# Patient Record
Sex: Female | Born: 1950 | Race: White | Hispanic: No | Marital: Married | Smoking: Never smoker
Health system: Southern US, Community
[De-identification: ages and names within clinical notes are randomized; demographics above are authoritative.]

## PROBLEM LIST (undated history)

## (undated) DIAGNOSIS — I10 Essential (primary) hypertension: Secondary | ICD-10-CM

## (undated) HISTORY — PX: INCONTINENCE SURGERY: SHX676

## (undated) HISTORY — DX: Essential (primary) hypertension: I10

## (undated) HISTORY — PX: ABDOMINAL HYSTERECTOMY: SHX81

---

## 2014-02-12 ENCOUNTER — Other Ambulatory Visit: Payer: Self-pay | Admitting: Internal Medicine

## 2014-02-12 DIAGNOSIS — Z1231 Encounter for screening mammogram for malignant neoplasm of breast: Secondary | ICD-10-CM

## 2014-03-04 ENCOUNTER — Ambulatory Visit: Payer: Self-pay

## 2014-05-27 ENCOUNTER — Ambulatory Visit: Payer: Self-pay

## 2014-05-28 ENCOUNTER — Ambulatory Visit
Admission: RE | Admit: 2014-05-28 | Discharge: 2014-05-28 | Disposition: A | Discharge status: Home / Self Care | Payer: 59 | Source: Ambulatory Visit | Attending: Internal Medicine | Admitting: Internal Medicine

## 2014-05-28 ENCOUNTER — Encounter (INDEPENDENT_AMBULATORY_CARE_PROVIDER_SITE_OTHER): Payer: Self-pay

## 2014-05-28 DIAGNOSIS — Z1231 Encounter for screening mammogram for malignant neoplasm of breast: Secondary | ICD-10-CM

## 2015-02-19 DIAGNOSIS — M25561 Pain in right knee: Secondary | ICD-10-CM | POA: Diagnosis not present

## 2015-02-19 DIAGNOSIS — M25562 Pain in left knee: Secondary | ICD-10-CM | POA: Diagnosis not present

## 2015-02-19 DIAGNOSIS — M1990 Unspecified osteoarthritis, unspecified site: Secondary | ICD-10-CM | POA: Diagnosis not present

## 2015-05-07 DIAGNOSIS — R197 Diarrhea, unspecified: Secondary | ICD-10-CM | POA: Diagnosis not present

## 2015-05-07 DIAGNOSIS — K219 Gastro-esophageal reflux disease without esophagitis: Secondary | ICD-10-CM | POA: Diagnosis not present

## 2015-05-07 DIAGNOSIS — R1310 Dysphagia, unspecified: Secondary | ICD-10-CM | POA: Diagnosis not present

## 2015-05-07 DIAGNOSIS — R1010 Upper abdominal pain, unspecified: Secondary | ICD-10-CM | POA: Diagnosis not present

## 2015-05-07 DIAGNOSIS — K580 Irritable bowel syndrome with diarrhea: Secondary | ICD-10-CM | POA: Diagnosis not present

## 2015-05-07 DIAGNOSIS — R140 Abdominal distension (gaseous): Secondary | ICD-10-CM | POA: Diagnosis not present

## 2015-05-14 DIAGNOSIS — Z0000 Encounter for general adult medical examination without abnormal findings: Secondary | ICD-10-CM | POA: Diagnosis not present

## 2015-05-14 DIAGNOSIS — K589 Irritable bowel syndrome without diarrhea: Secondary | ICD-10-CM | POA: Diagnosis not present

## 2015-05-14 DIAGNOSIS — Z1389 Encounter for screening for other disorder: Secondary | ICD-10-CM | POA: Diagnosis not present

## 2015-05-14 DIAGNOSIS — K219 Gastro-esophageal reflux disease without esophagitis: Secondary | ICD-10-CM | POA: Diagnosis not present

## 2015-05-14 DIAGNOSIS — I10 Essential (primary) hypertension: Secondary | ICD-10-CM | POA: Diagnosis not present

## 2015-05-14 DIAGNOSIS — M8588 Other specified disorders of bone density and structure, other site: Secondary | ICD-10-CM | POA: Diagnosis not present

## 2015-05-14 DIAGNOSIS — E782 Mixed hyperlipidemia: Secondary | ICD-10-CM | POA: Diagnosis not present

## 2015-11-10 DIAGNOSIS — E782 Mixed hyperlipidemia: Secondary | ICD-10-CM | POA: Diagnosis not present

## 2015-11-10 DIAGNOSIS — K219 Gastro-esophageal reflux disease without esophagitis: Secondary | ICD-10-CM | POA: Diagnosis not present

## 2015-11-10 DIAGNOSIS — I10 Essential (primary) hypertension: Secondary | ICD-10-CM | POA: Diagnosis not present

## 2016-03-17 DIAGNOSIS — R140 Abdominal distension (gaseous): Secondary | ICD-10-CM | POA: Diagnosis not present

## 2016-03-17 DIAGNOSIS — K589 Irritable bowel syndrome without diarrhea: Secondary | ICD-10-CM | POA: Diagnosis not present

## 2016-03-17 DIAGNOSIS — Z79899 Other long term (current) drug therapy: Secondary | ICD-10-CM | POA: Diagnosis not present

## 2016-05-29 ENCOUNTER — Other Ambulatory Visit: Payer: Self-pay | Admitting: Internal Medicine

## 2016-05-29 DIAGNOSIS — Z0000 Encounter for general adult medical examination without abnormal findings: Secondary | ICD-10-CM | POA: Diagnosis not present

## 2016-05-29 DIAGNOSIS — Z1159 Encounter for screening for other viral diseases: Secondary | ICD-10-CM | POA: Diagnosis not present

## 2016-05-29 DIAGNOSIS — K219 Gastro-esophageal reflux disease without esophagitis: Secondary | ICD-10-CM | POA: Diagnosis not present

## 2016-05-29 DIAGNOSIS — M1990 Unspecified osteoarthritis, unspecified site: Secondary | ICD-10-CM | POA: Diagnosis not present

## 2016-05-29 DIAGNOSIS — Z1231 Encounter for screening mammogram for malignant neoplasm of breast: Secondary | ICD-10-CM

## 2016-05-29 DIAGNOSIS — I10 Essential (primary) hypertension: Secondary | ICD-10-CM | POA: Diagnosis not present

## 2016-05-29 DIAGNOSIS — M8588 Other specified disorders of bone density and structure, other site: Secondary | ICD-10-CM | POA: Diagnosis not present

## 2016-05-29 DIAGNOSIS — K589 Irritable bowel syndrome without diarrhea: Secondary | ICD-10-CM | POA: Diagnosis not present

## 2016-05-29 DIAGNOSIS — Z1389 Encounter for screening for other disorder: Secondary | ICD-10-CM | POA: Diagnosis not present

## 2016-05-29 DIAGNOSIS — E782 Mixed hyperlipidemia: Secondary | ICD-10-CM | POA: Diagnosis not present

## 2016-07-07 ENCOUNTER — Ambulatory Visit: Payer: Self-pay

## 2016-07-11 DIAGNOSIS — R7301 Impaired fasting glucose: Secondary | ICD-10-CM | POA: Diagnosis not present

## 2016-07-17 ENCOUNTER — Ambulatory Visit
Admission: RE | Admit: 2016-07-17 | Discharge: 2016-07-17 | Disposition: A | Discharge status: Home / Self Care | Payer: Medicare Other | Source: Ambulatory Visit | Attending: Internal Medicine | Admitting: Internal Medicine

## 2016-07-17 DIAGNOSIS — Z1231 Encounter for screening mammogram for malignant neoplasm of breast: Secondary | ICD-10-CM | POA: Diagnosis not present

## 2016-11-29 DIAGNOSIS — K219 Gastro-esophageal reflux disease without esophagitis: Secondary | ICD-10-CM | POA: Diagnosis not present

## 2016-11-29 DIAGNOSIS — I10 Essential (primary) hypertension: Secondary | ICD-10-CM | POA: Diagnosis not present

## 2016-11-29 DIAGNOSIS — E782 Mixed hyperlipidemia: Secondary | ICD-10-CM | POA: Diagnosis not present

## 2016-11-29 DIAGNOSIS — M1990 Unspecified osteoarthritis, unspecified site: Secondary | ICD-10-CM | POA: Diagnosis not present

## 2017-05-08 DIAGNOSIS — M25562 Pain in left knee: Secondary | ICD-10-CM | POA: Diagnosis not present

## 2017-05-08 DIAGNOSIS — M25561 Pain in right knee: Secondary | ICD-10-CM | POA: Diagnosis not present

## 2017-05-11 DIAGNOSIS — M25461 Effusion, right knee: Secondary | ICD-10-CM | POA: Diagnosis not present

## 2017-05-11 DIAGNOSIS — M23321 Other meniscus derangements, posterior horn of medial meniscus, right knee: Secondary | ICD-10-CM | POA: Diagnosis not present

## 2017-05-11 DIAGNOSIS — M1710 Unilateral primary osteoarthritis, unspecified knee: Secondary | ICD-10-CM | POA: Diagnosis not present

## 2017-05-11 DIAGNOSIS — M25561 Pain in right knee: Secondary | ICD-10-CM | POA: Diagnosis not present

## 2017-06-05 DIAGNOSIS — M25561 Pain in right knee: Secondary | ICD-10-CM | POA: Diagnosis not present

## 2017-06-06 DIAGNOSIS — Z1239 Encounter for other screening for malignant neoplasm of breast: Secondary | ICD-10-CM | POA: Diagnosis not present

## 2017-06-06 DIAGNOSIS — Z0000 Encounter for general adult medical examination without abnormal findings: Secondary | ICD-10-CM | POA: Diagnosis not present

## 2017-06-06 DIAGNOSIS — F341 Dysthymic disorder: Secondary | ICD-10-CM | POA: Diagnosis not present

## 2017-06-06 DIAGNOSIS — E782 Mixed hyperlipidemia: Secondary | ICD-10-CM | POA: Diagnosis not present

## 2017-06-06 DIAGNOSIS — I10 Essential (primary) hypertension: Secondary | ICD-10-CM | POA: Diagnosis not present

## 2017-06-06 DIAGNOSIS — R7303 Prediabetes: Secondary | ICD-10-CM | POA: Diagnosis not present

## 2017-06-06 DIAGNOSIS — M8588 Other specified disorders of bone density and structure, other site: Secondary | ICD-10-CM | POA: Diagnosis not present

## 2017-06-06 DIAGNOSIS — M1990 Unspecified osteoarthritis, unspecified site: Secondary | ICD-10-CM | POA: Diagnosis not present

## 2017-06-06 DIAGNOSIS — K219 Gastro-esophageal reflux disease without esophagitis: Secondary | ICD-10-CM | POA: Diagnosis not present

## 2017-06-11 IMAGING — MG 2D DIGITAL SCREENING BILATERAL MAMMOGRAM WITH CAD AND ADJUNCT TO
8 of 13 series · 8 of 29 positions shown · non-contrast
Comparison: Previous exam(s).

CLINICAL DATA: Screening.

EXAM:
2D DIGITAL SCREENING BILATERAL MAMMOGRAM WITH CAD AND ADJUNCT TOMO

[R CC]
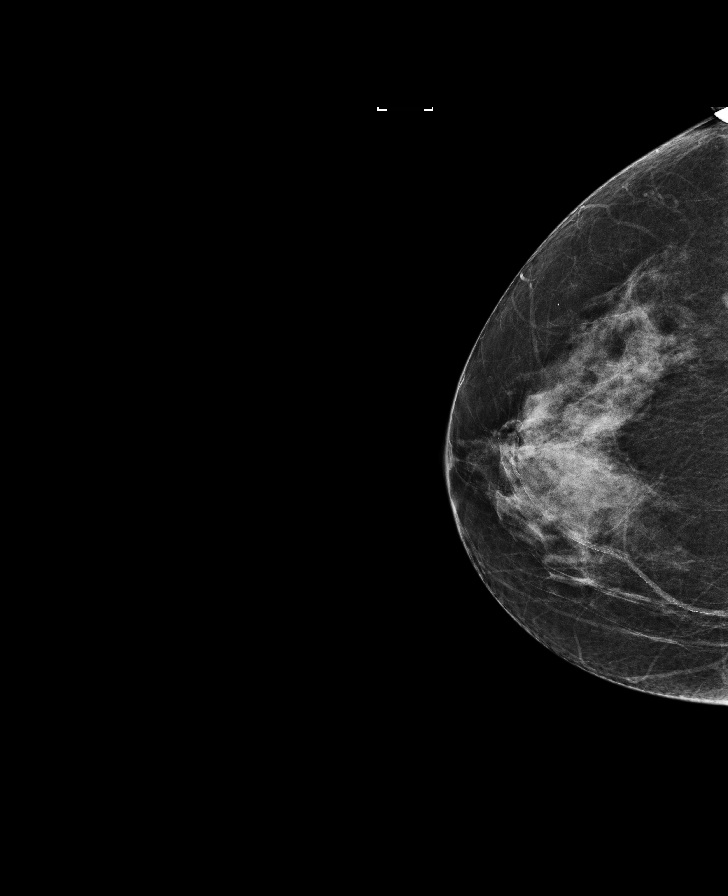

[R MLO synth-2D]
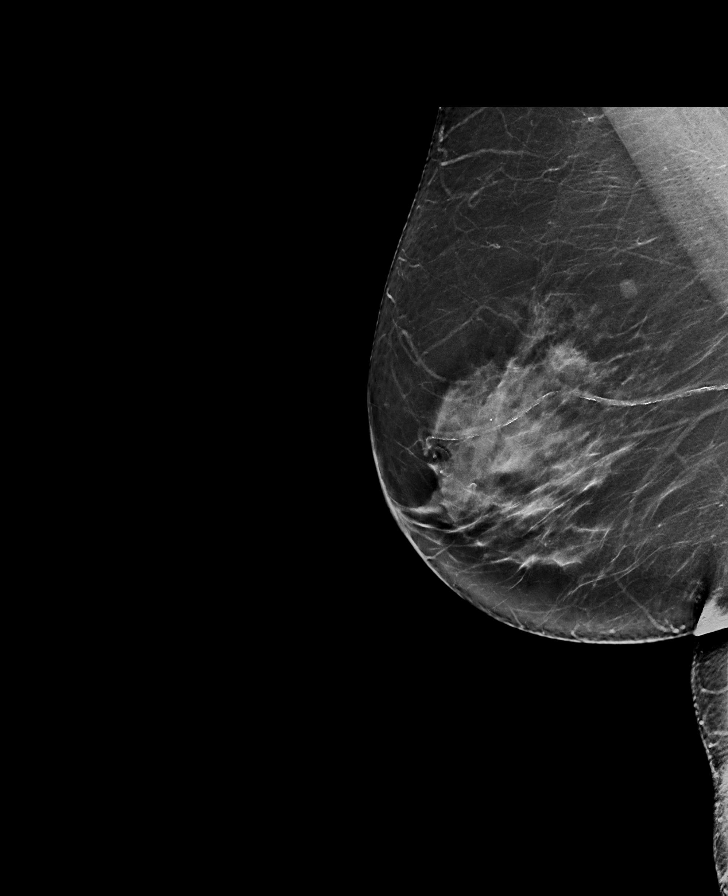

[L MLO synth-2D]
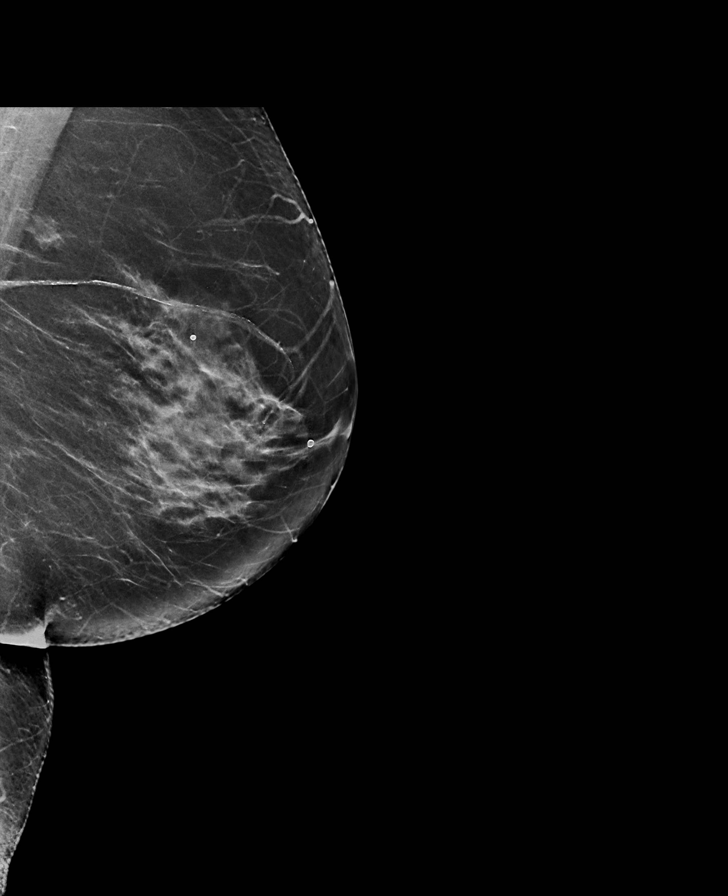

[R MLO]
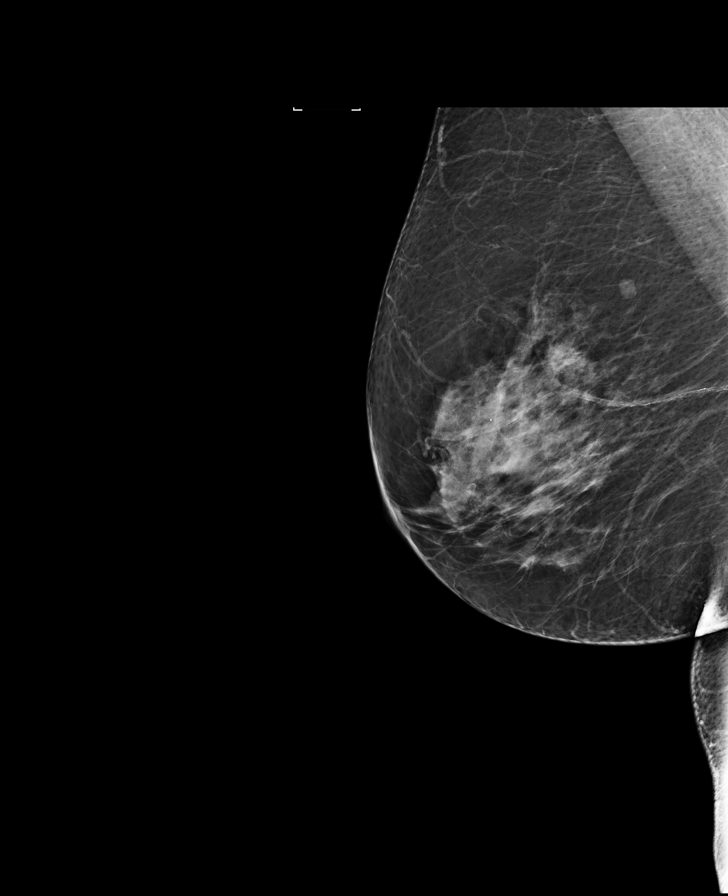

[L CC synth-2D]
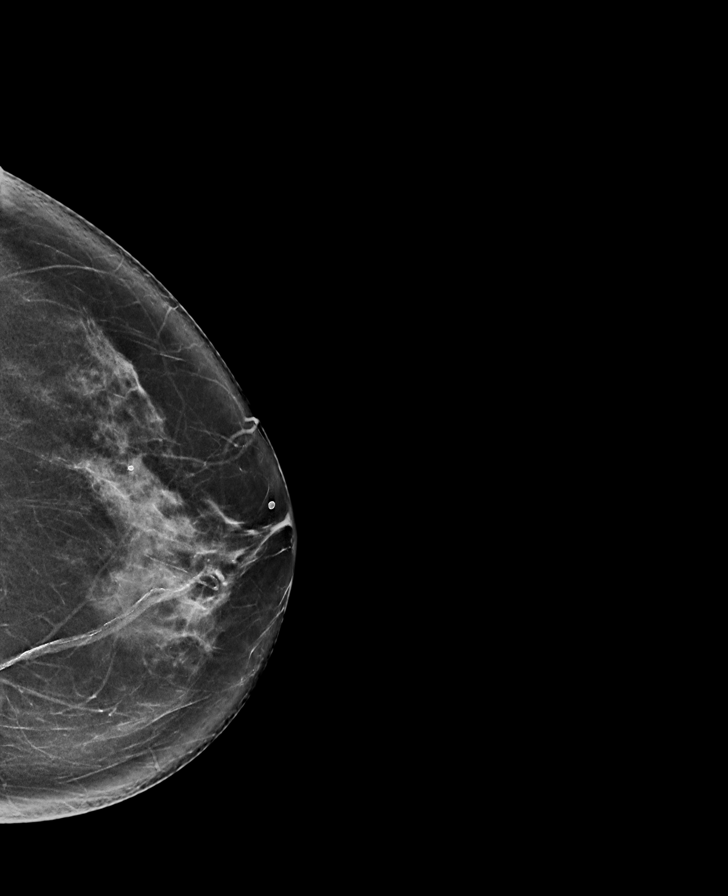

[L CC]
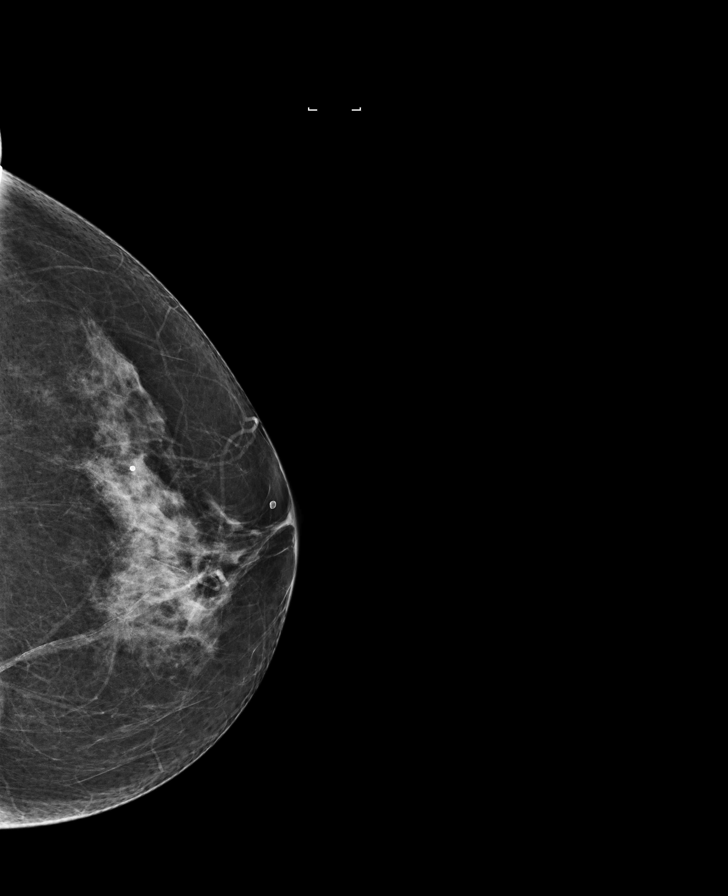

[R CC synth-2D]
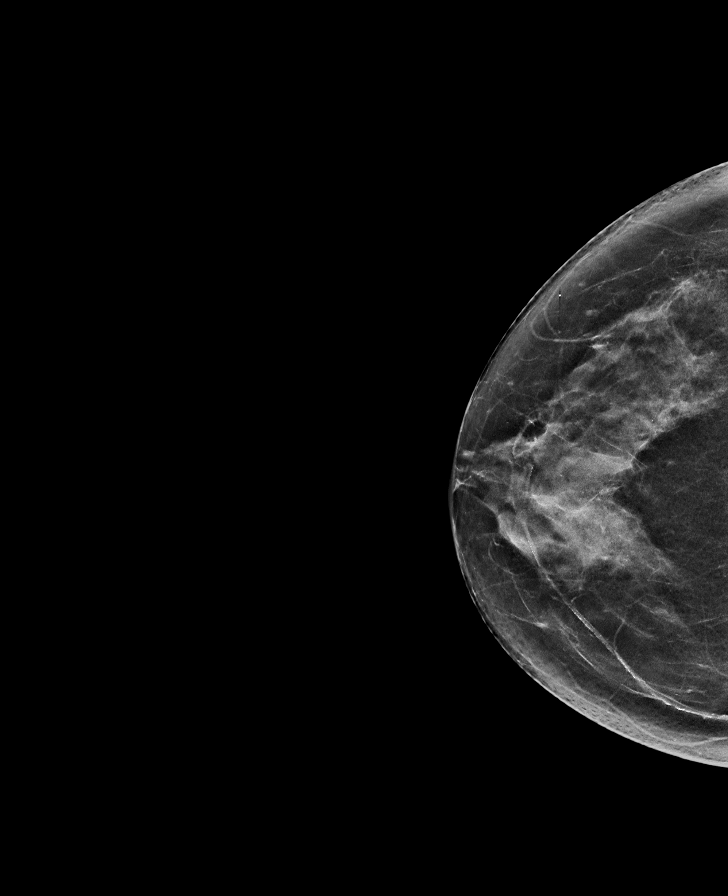

[L MLO]
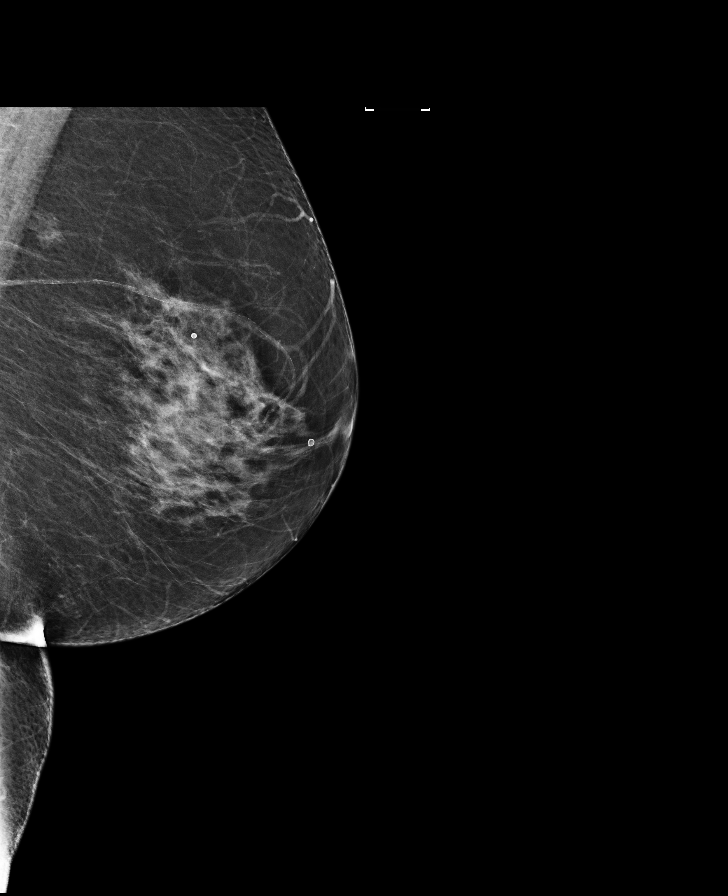

[8 of 29 positions shown; findings below may reference images not displayed]

ACR Breast Density Category c: The breast tissue is heterogeneously
dense, which may obscure small masses.
FINDINGS: There are no findings suspicious for malignancy. Images were
processed with CAD.
IMPRESSION: No mammographic evidence of malignancy. A result letter of this
screening mammogram will be mailed directly to the patient.

RECOMMENDATION:
Screening mammogram in one year. (Code:[id number])

BI-RADS CATEGORY  1: Negative.

## 2017-07-04 DIAGNOSIS — E7800 Pure hypercholesterolemia, unspecified: Secondary | ICD-10-CM | POA: Diagnosis not present

## 2017-07-04 DIAGNOSIS — G8918 Other acute postprocedural pain: Secondary | ICD-10-CM | POA: Diagnosis not present

## 2017-07-04 DIAGNOSIS — S83241A Other tear of medial meniscus, current injury, right knee, initial encounter: Secondary | ICD-10-CM | POA: Diagnosis not present

## 2017-07-04 DIAGNOSIS — Z79899 Other long term (current) drug therapy: Secondary | ICD-10-CM | POA: Diagnosis not present

## 2017-07-04 DIAGNOSIS — M23221 Derangement of posterior horn of medial meniscus due to old tear or injury, right knee: Secondary | ICD-10-CM | POA: Diagnosis not present

## 2017-07-04 DIAGNOSIS — M25561 Pain in right knee: Secondary | ICD-10-CM | POA: Diagnosis not present

## 2017-07-04 DIAGNOSIS — M1990 Unspecified osteoarthritis, unspecified site: Secondary | ICD-10-CM | POA: Diagnosis not present

## 2017-07-04 DIAGNOSIS — M25562 Pain in left knee: Secondary | ICD-10-CM | POA: Diagnosis not present

## 2017-07-04 DIAGNOSIS — F329 Major depressive disorder, single episode, unspecified: Secondary | ICD-10-CM | POA: Diagnosis not present

## 2017-07-04 DIAGNOSIS — Z9889 Other specified postprocedural states: Secondary | ICD-10-CM | POA: Diagnosis not present

## 2017-07-04 DIAGNOSIS — M1711 Unilateral primary osteoarthritis, right knee: Secondary | ICD-10-CM | POA: Diagnosis not present

## 2017-07-04 DIAGNOSIS — K219 Gastro-esophageal reflux disease without esophagitis: Secondary | ICD-10-CM | POA: Diagnosis not present

## 2017-07-04 DIAGNOSIS — M23331 Other meniscus derangements, other medial meniscus, right knee: Secondary | ICD-10-CM | POA: Diagnosis not present

## 2017-07-04 DIAGNOSIS — Z888 Allergy status to other drugs, medicaments and biological substances status: Secondary | ICD-10-CM | POA: Diagnosis not present

## 2017-07-04 DIAGNOSIS — Z882 Allergy status to sulfonamides status: Secondary | ICD-10-CM | POA: Diagnosis not present

## 2017-07-04 DIAGNOSIS — I10 Essential (primary) hypertension: Secondary | ICD-10-CM | POA: Diagnosis not present

## 2017-07-04 DIAGNOSIS — I499 Cardiac arrhythmia, unspecified: Secondary | ICD-10-CM | POA: Diagnosis not present

## 2017-07-11 DIAGNOSIS — M25561 Pain in right knee: Secondary | ICD-10-CM | POA: Diagnosis not present

## 2017-07-11 DIAGNOSIS — R6889 Other general symptoms and signs: Secondary | ICD-10-CM | POA: Diagnosis not present

## 2017-12-05 ENCOUNTER — Other Ambulatory Visit: Payer: Self-pay | Admitting: Internal Medicine

## 2017-12-05 DIAGNOSIS — K219 Gastro-esophageal reflux disease without esophagitis: Secondary | ICD-10-CM | POA: Diagnosis not present

## 2017-12-05 DIAGNOSIS — F341 Dysthymic disorder: Secondary | ICD-10-CM | POA: Diagnosis not present

## 2017-12-05 DIAGNOSIS — E782 Mixed hyperlipidemia: Secondary | ICD-10-CM | POA: Diagnosis not present

## 2017-12-05 DIAGNOSIS — I10 Essential (primary) hypertension: Secondary | ICD-10-CM | POA: Diagnosis not present

## 2017-12-05 DIAGNOSIS — Z139 Encounter for screening, unspecified: Secondary | ICD-10-CM

## 2017-12-05 DIAGNOSIS — R21 Rash and other nonspecific skin eruption: Secondary | ICD-10-CM | POA: Diagnosis not present

## 2017-12-05 DIAGNOSIS — Z1231 Encounter for screening mammogram for malignant neoplasm of breast: Secondary | ICD-10-CM | POA: Diagnosis not present

## 2018-01-07 ENCOUNTER — Ambulatory Visit
Admission: RE | Admit: 2018-01-07 | Discharge: 2018-01-07 | Disposition: A | Discharge status: Home / Self Care | Payer: Medicare Other | Source: Ambulatory Visit | Attending: Internal Medicine | Admitting: Internal Medicine

## 2018-01-07 DIAGNOSIS — Z139 Encounter for screening, unspecified: Secondary | ICD-10-CM

## 2018-01-07 DIAGNOSIS — Z1231 Encounter for screening mammogram for malignant neoplasm of breast: Secondary | ICD-10-CM | POA: Diagnosis not present

## 2018-06-24 DIAGNOSIS — K2210 Ulcer of esophagus without bleeding: Secondary | ICD-10-CM | POA: Diagnosis not present

## 2018-06-28 DIAGNOSIS — E782 Mixed hyperlipidemia: Secondary | ICD-10-CM | POA: Diagnosis not present

## 2018-06-28 DIAGNOSIS — M1990 Unspecified osteoarthritis, unspecified site: Secondary | ICD-10-CM | POA: Diagnosis not present

## 2018-06-28 DIAGNOSIS — K589 Irritable bowel syndrome without diarrhea: Secondary | ICD-10-CM | POA: Diagnosis not present

## 2018-06-28 DIAGNOSIS — Z1389 Encounter for screening for other disorder: Secondary | ICD-10-CM | POA: Diagnosis not present

## 2018-06-28 DIAGNOSIS — Z1211 Encounter for screening for malignant neoplasm of colon: Secondary | ICD-10-CM | POA: Diagnosis not present

## 2018-06-28 DIAGNOSIS — M8588 Other specified disorders of bone density and structure, other site: Secondary | ICD-10-CM | POA: Diagnosis not present

## 2018-06-28 DIAGNOSIS — K219 Gastro-esophageal reflux disease without esophagitis: Secondary | ICD-10-CM | POA: Diagnosis not present

## 2018-06-28 DIAGNOSIS — Z0000 Encounter for general adult medical examination without abnormal findings: Secondary | ICD-10-CM | POA: Diagnosis not present

## 2018-06-28 DIAGNOSIS — F341 Dysthymic disorder: Secondary | ICD-10-CM | POA: Diagnosis not present

## 2018-06-28 DIAGNOSIS — R7303 Prediabetes: Secondary | ICD-10-CM | POA: Diagnosis not present

## 2018-06-28 DIAGNOSIS — I10 Essential (primary) hypertension: Secondary | ICD-10-CM | POA: Diagnosis not present

## 2018-07-02 DIAGNOSIS — Z1211 Encounter for screening for malignant neoplasm of colon: Secondary | ICD-10-CM | POA: Diagnosis not present

## 2018-07-05 ENCOUNTER — Other Ambulatory Visit: Payer: Self-pay | Admitting: Internal Medicine

## 2018-07-05 DIAGNOSIS — M8580 Other specified disorders of bone density and structure, unspecified site: Secondary | ICD-10-CM

## 2018-07-09 ENCOUNTER — Other Ambulatory Visit: Payer: Self-pay | Admitting: Internal Medicine

## 2018-07-09 DIAGNOSIS — M8580 Other specified disorders of bone density and structure, unspecified site: Secondary | ICD-10-CM

## 2018-07-24 DIAGNOSIS — K317 Polyp of stomach and duodenum: Secondary | ICD-10-CM | POA: Diagnosis not present

## 2018-07-24 DIAGNOSIS — K228 Other specified diseases of esophagus: Secondary | ICD-10-CM | POA: Diagnosis not present

## 2018-07-24 DIAGNOSIS — K449 Diaphragmatic hernia without obstruction or gangrene: Secondary | ICD-10-CM | POA: Diagnosis not present

## 2018-07-24 DIAGNOSIS — K210 Gastro-esophageal reflux disease with esophagitis: Secondary | ICD-10-CM | POA: Diagnosis not present

## 2018-09-04 ENCOUNTER — Other Ambulatory Visit: Payer: Medicare Other

## 2018-10-28 DIAGNOSIS — J029 Acute pharyngitis, unspecified: Secondary | ICD-10-CM | POA: Diagnosis not present

## 2018-12-30 DIAGNOSIS — R0609 Other forms of dyspnea: Secondary | ICD-10-CM | POA: Diagnosis not present

## 2018-12-30 DIAGNOSIS — I10 Essential (primary) hypertension: Secondary | ICD-10-CM | POA: Diagnosis not present

## 2018-12-30 DIAGNOSIS — E782 Mixed hyperlipidemia: Secondary | ICD-10-CM | POA: Diagnosis not present

## 2018-12-30 DIAGNOSIS — K219 Gastro-esophageal reflux disease without esophagitis: Secondary | ICD-10-CM | POA: Diagnosis not present

## 2018-12-30 DIAGNOSIS — R7303 Prediabetes: Secondary | ICD-10-CM | POA: Diagnosis not present

## 2019-02-25 DIAGNOSIS — M1990 Unspecified osteoarthritis, unspecified site: Secondary | ICD-10-CM | POA: Diagnosis not present

## 2019-02-25 DIAGNOSIS — I10 Essential (primary) hypertension: Secondary | ICD-10-CM | POA: Diagnosis not present

## 2019-02-25 DIAGNOSIS — F341 Dysthymic disorder: Secondary | ICD-10-CM | POA: Diagnosis not present

## 2019-02-25 DIAGNOSIS — E782 Mixed hyperlipidemia: Secondary | ICD-10-CM | POA: Diagnosis not present

## 2019-03-11 DIAGNOSIS — R0789 Other chest pain: Secondary | ICD-10-CM | POA: Diagnosis not present

## 2019-03-12 ENCOUNTER — Ambulatory Visit
Admission: RE | Admit: 2019-03-12 | Discharge: 2019-03-12 | Disposition: A | Discharge status: Home / Self Care | Payer: Medicare Other | Source: Ambulatory Visit | Attending: Internal Medicine | Admitting: Internal Medicine

## 2019-03-12 ENCOUNTER — Other Ambulatory Visit: Payer: Self-pay | Admitting: Internal Medicine

## 2019-03-12 ENCOUNTER — Other Ambulatory Visit: Payer: Self-pay

## 2019-03-12 DIAGNOSIS — R0789 Other chest pain: Secondary | ICD-10-CM

## 2019-03-12 DIAGNOSIS — K449 Diaphragmatic hernia without obstruction or gangrene: Secondary | ICD-10-CM | POA: Diagnosis not present

## 2019-05-27 DIAGNOSIS — R0600 Dyspnea, unspecified: Secondary | ICD-10-CM | POA: Diagnosis not present

## 2019-05-27 DIAGNOSIS — I10 Essential (primary) hypertension: Secondary | ICD-10-CM | POA: Diagnosis not present

## 2019-05-27 DIAGNOSIS — J984 Other disorders of lung: Secondary | ICD-10-CM | POA: Diagnosis not present

## 2019-05-28 DIAGNOSIS — E782 Mixed hyperlipidemia: Secondary | ICD-10-CM | POA: Diagnosis not present

## 2019-05-28 DIAGNOSIS — M1990 Unspecified osteoarthritis, unspecified site: Secondary | ICD-10-CM | POA: Diagnosis not present

## 2019-05-28 DIAGNOSIS — I10 Essential (primary) hypertension: Secondary | ICD-10-CM | POA: Diagnosis not present

## 2019-05-28 DIAGNOSIS — F341 Dysthymic disorder: Secondary | ICD-10-CM | POA: Diagnosis not present

## 2019-07-07 DIAGNOSIS — Z1389 Encounter for screening for other disorder: Secondary | ICD-10-CM | POA: Diagnosis not present

## 2019-07-07 DIAGNOSIS — K219 Gastro-esophageal reflux disease without esophagitis: Secondary | ICD-10-CM | POA: Diagnosis not present

## 2019-07-07 DIAGNOSIS — K589 Irritable bowel syndrome without diarrhea: Secondary | ICD-10-CM | POA: Diagnosis not present

## 2019-07-07 DIAGNOSIS — Z1211 Encounter for screening for malignant neoplasm of colon: Secondary | ICD-10-CM | POA: Diagnosis not present

## 2019-07-07 DIAGNOSIS — M1990 Unspecified osteoarthritis, unspecified site: Secondary | ICD-10-CM | POA: Diagnosis not present

## 2019-07-07 DIAGNOSIS — E782 Mixed hyperlipidemia: Secondary | ICD-10-CM | POA: Diagnosis not present

## 2019-07-07 DIAGNOSIS — Z1239 Encounter for other screening for malignant neoplasm of breast: Secondary | ICD-10-CM | POA: Diagnosis not present

## 2019-07-07 DIAGNOSIS — Z0000 Encounter for general adult medical examination without abnormal findings: Secondary | ICD-10-CM | POA: Diagnosis not present

## 2019-07-07 DIAGNOSIS — R7309 Other abnormal glucose: Secondary | ICD-10-CM | POA: Diagnosis not present

## 2019-07-07 DIAGNOSIS — F341 Dysthymic disorder: Secondary | ICD-10-CM | POA: Diagnosis not present

## 2019-07-07 DIAGNOSIS — I10 Essential (primary) hypertension: Secondary | ICD-10-CM | POA: Diagnosis not present

## 2019-07-07 DIAGNOSIS — M8588 Other specified disorders of bone density and structure, other site: Secondary | ICD-10-CM | POA: Diagnosis not present

## 2019-07-07 DIAGNOSIS — Z23 Encounter for immunization: Secondary | ICD-10-CM | POA: Diagnosis not present

## 2019-07-14 DIAGNOSIS — I10 Essential (primary) hypertension: Secondary | ICD-10-CM | POA: Diagnosis not present

## 2019-07-14 DIAGNOSIS — R0600 Dyspnea, unspecified: Secondary | ICD-10-CM | POA: Diagnosis not present

## 2019-07-17 DIAGNOSIS — Z1211 Encounter for screening for malignant neoplasm of colon: Secondary | ICD-10-CM | POA: Diagnosis not present

## 2019-09-15 DIAGNOSIS — R05 Cough: Secondary | ICD-10-CM | POA: Diagnosis not present

## 2019-09-17 DIAGNOSIS — J209 Acute bronchitis, unspecified: Secondary | ICD-10-CM | POA: Diagnosis not present

## 2019-09-17 DIAGNOSIS — Z20822 Contact with and (suspected) exposure to covid-19: Secondary | ICD-10-CM | POA: Diagnosis not present

## 2019-09-17 DIAGNOSIS — R05 Cough: Secondary | ICD-10-CM | POA: Diagnosis not present

## 2019-09-26 DIAGNOSIS — N390 Urinary tract infection, site not specified: Secondary | ICD-10-CM | POA: Diagnosis not present

## 2019-09-26 DIAGNOSIS — F329 Major depressive disorder, single episode, unspecified: Secondary | ICD-10-CM | POA: Diagnosis not present

## 2019-09-26 DIAGNOSIS — Z79899 Other long term (current) drug therapy: Secondary | ICD-10-CM | POA: Diagnosis not present

## 2019-09-26 DIAGNOSIS — Z85828 Personal history of other malignant neoplasm of skin: Secondary | ICD-10-CM | POA: Diagnosis not present

## 2019-09-26 DIAGNOSIS — B379 Candidiasis, unspecified: Secondary | ICD-10-CM | POA: Diagnosis not present

## 2019-09-26 DIAGNOSIS — K219 Gastro-esophageal reflux disease without esophagitis: Secondary | ICD-10-CM | POA: Diagnosis not present

## 2019-09-26 DIAGNOSIS — Z882 Allergy status to sulfonamides status: Secondary | ICD-10-CM | POA: Diagnosis not present

## 2019-09-29 DIAGNOSIS — M1990 Unspecified osteoarthritis, unspecified site: Secondary | ICD-10-CM | POA: Diagnosis not present

## 2019-09-29 DIAGNOSIS — F341 Dysthymic disorder: Secondary | ICD-10-CM | POA: Diagnosis not present

## 2019-09-29 DIAGNOSIS — I10 Essential (primary) hypertension: Secondary | ICD-10-CM | POA: Diagnosis not present

## 2019-09-29 DIAGNOSIS — E782 Mixed hyperlipidemia: Secondary | ICD-10-CM | POA: Diagnosis not present

## 2019-09-30 DIAGNOSIS — B370 Candidal stomatitis: Secondary | ICD-10-CM | POA: Diagnosis not present

## 2019-09-30 DIAGNOSIS — R0609 Other forms of dyspnea: Secondary | ICD-10-CM | POA: Diagnosis not present

## 2019-09-30 DIAGNOSIS — N390 Urinary tract infection, site not specified: Secondary | ICD-10-CM | POA: Diagnosis not present

## 2019-10-08 DIAGNOSIS — N390 Urinary tract infection, site not specified: Secondary | ICD-10-CM | POA: Diagnosis not present

## 2019-11-03 DIAGNOSIS — R05 Cough: Secondary | ICD-10-CM | POA: Diagnosis not present

## 2019-11-03 DIAGNOSIS — R062 Wheezing: Secondary | ICD-10-CM | POA: Diagnosis not present

## 2019-11-03 DIAGNOSIS — J4541 Moderate persistent asthma with (acute) exacerbation: Secondary | ICD-10-CM | POA: Diagnosis not present

## 2019-11-03 DIAGNOSIS — J329 Chronic sinusitis, unspecified: Secondary | ICD-10-CM | POA: Diagnosis not present

## 2019-11-03 DIAGNOSIS — I10 Essential (primary) hypertension: Secondary | ICD-10-CM | POA: Diagnosis not present

## 2019-11-03 DIAGNOSIS — K219 Gastro-esophageal reflux disease without esophagitis: Secondary | ICD-10-CM | POA: Diagnosis not present

## 2020-02-04 IMAGING — CR RIGHT RIBS AND CHEST - 3+ VIEW
3 series · 3 of 3 positions shown · non-contrast
Comparison: No recent prior.

CLINICAL DATA: Chest pain.

EXAM:
RIGHT RIBS AND CHEST - 3+ VIEW

[w chest pa]
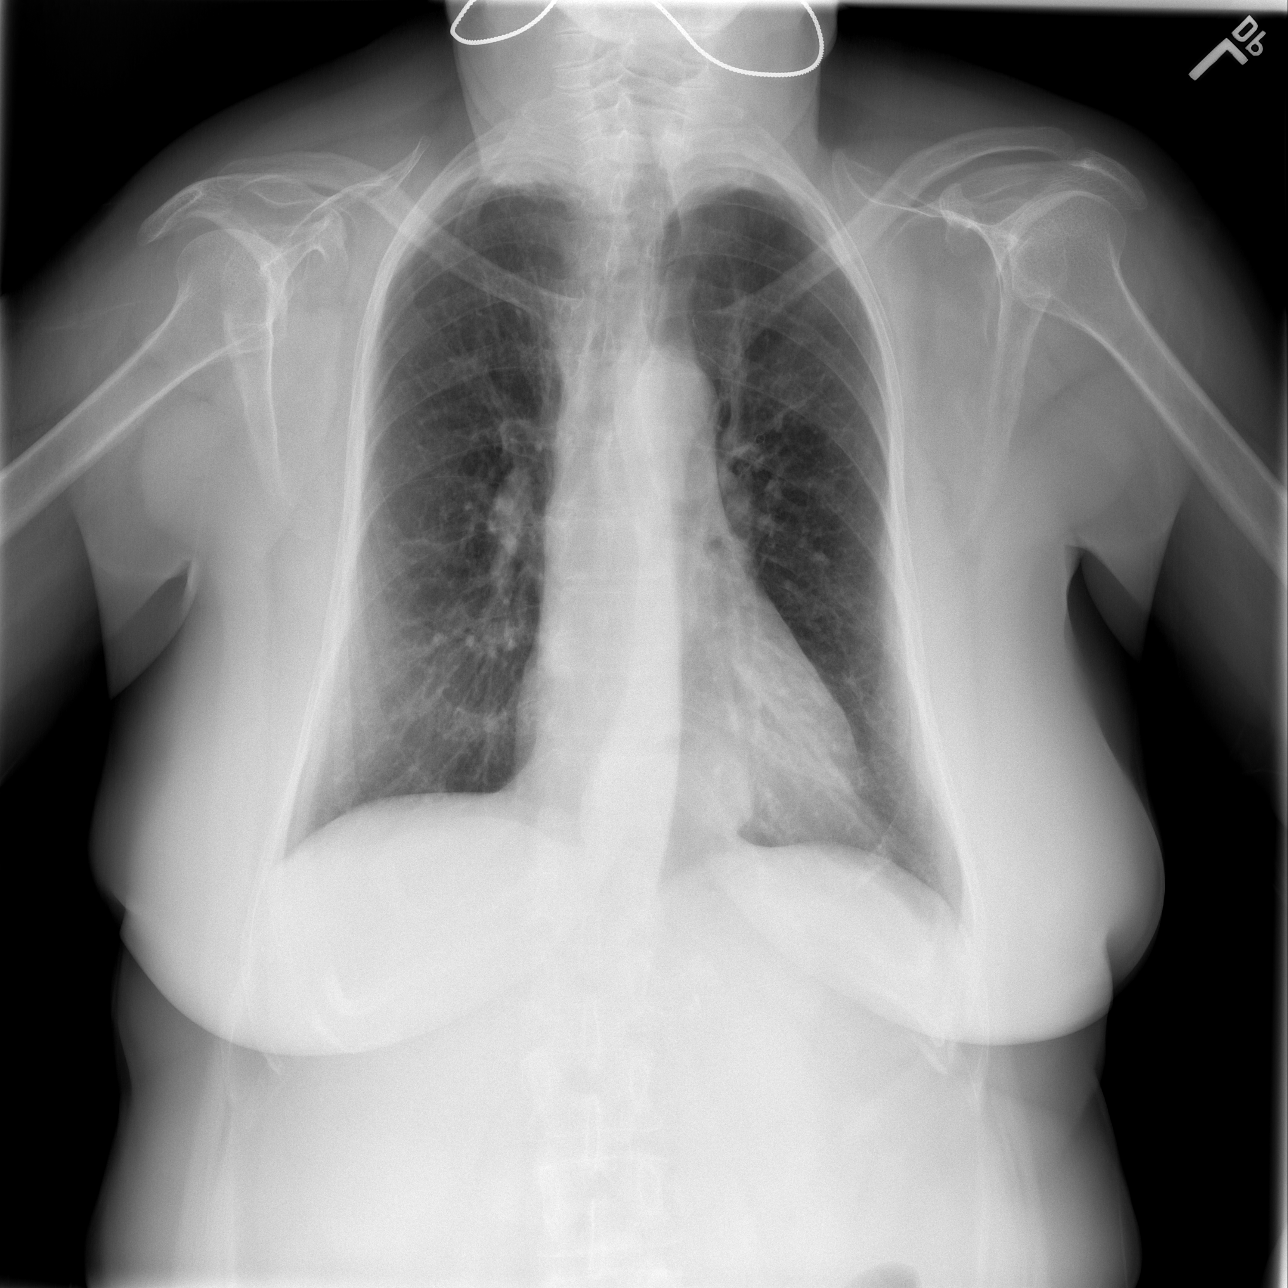

[w ribs ap/pa upper right *]
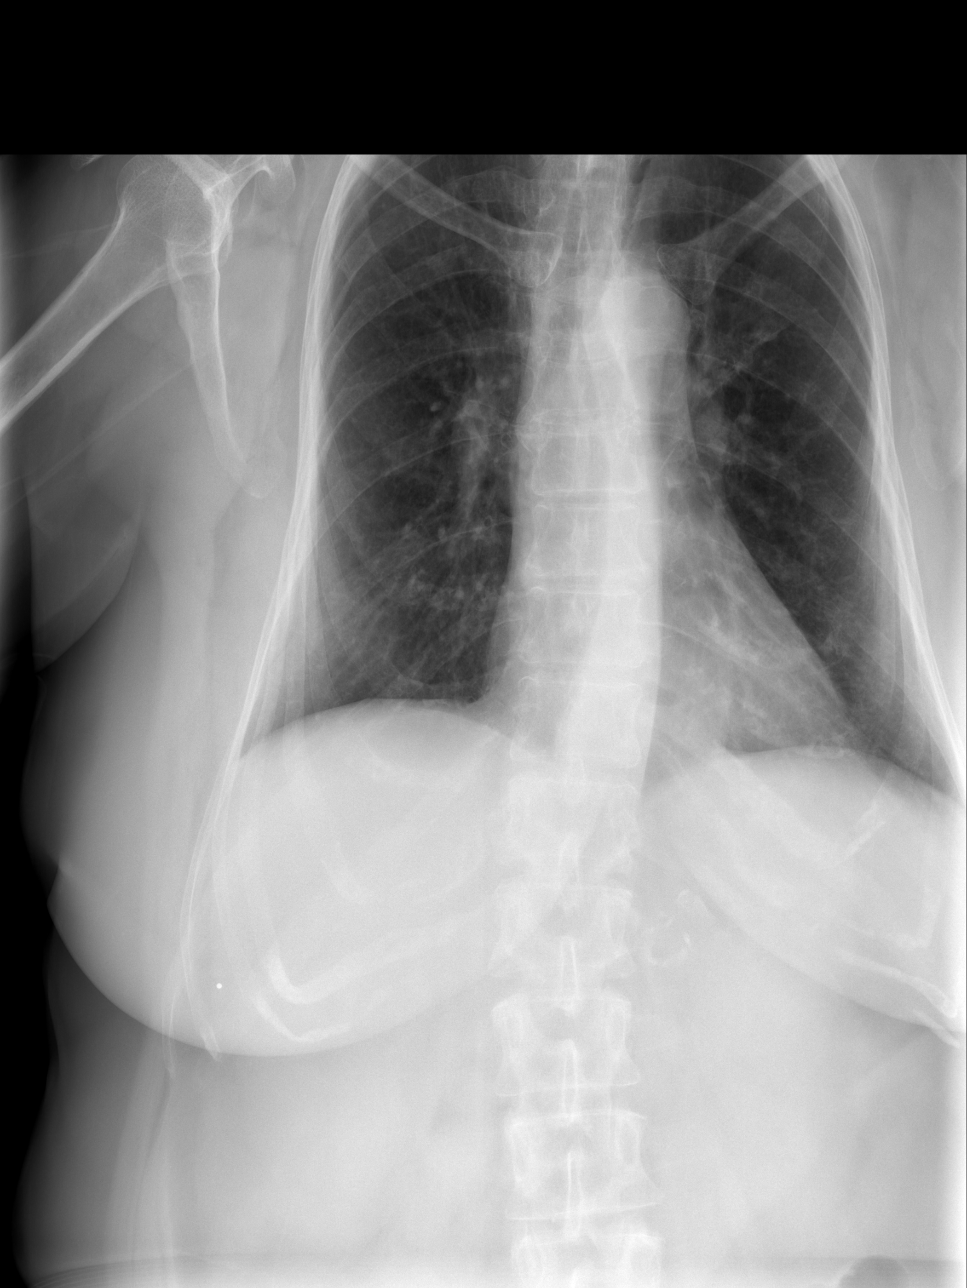

[w ribs ap/pa lower right *]
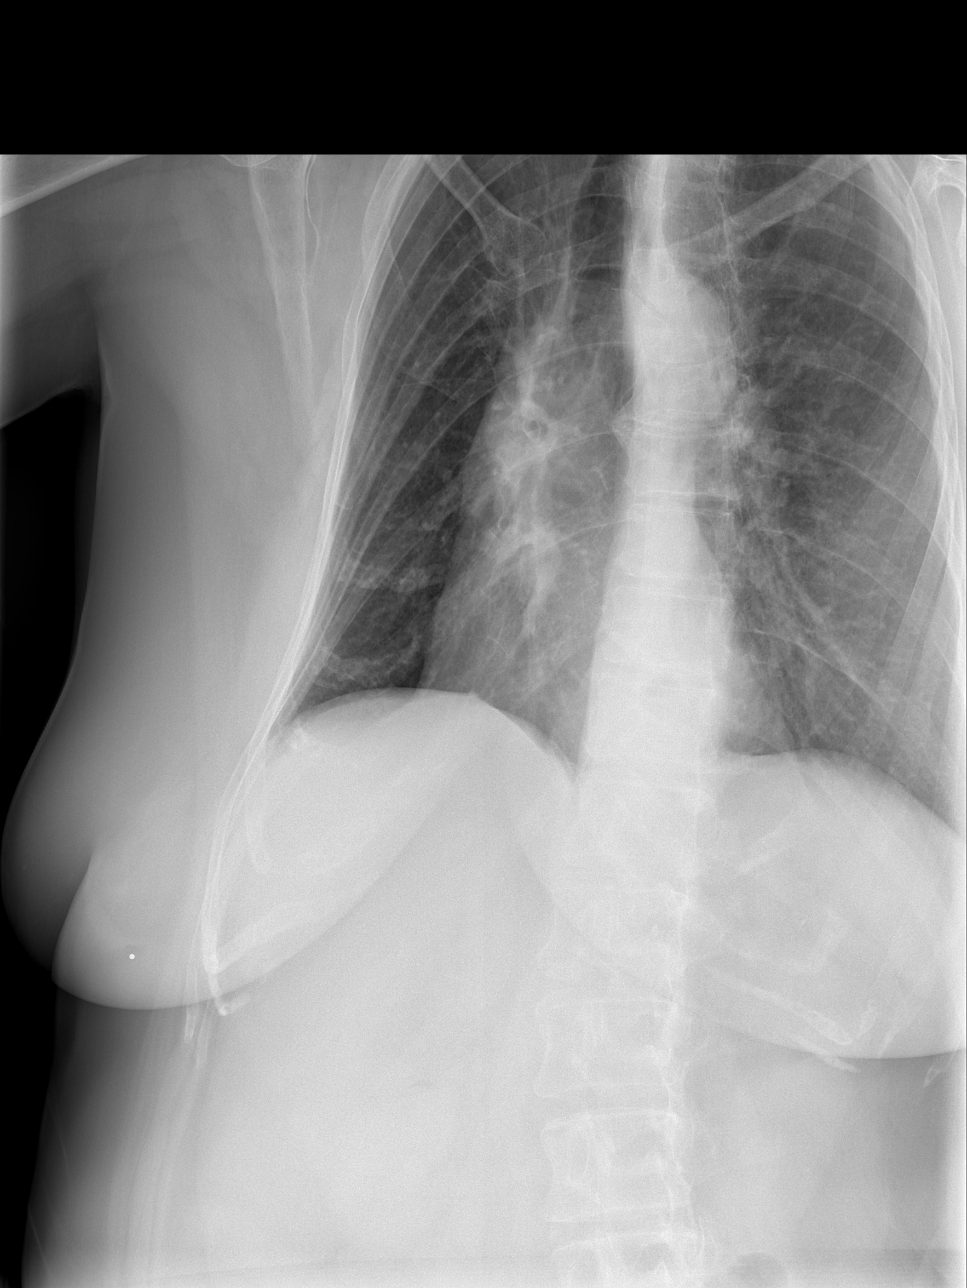

[3 of 3 positions shown; findings below may reference images not displayed]

FINDINGS: Mediastinum hilar structures normal. Heart size normal. Lungs are
clear. Symmetric biapical pleural thickening noted most consistent
with scarring. No pleural effusion or pneumothorax. Sliding hiatal
hernia. No evidence of displaced fracture. Thoracic spine scoliosis.
Visceral atherosclerotic vascular calcification.
IMPRESSION: 1.  No acute cardiopulmonary disease.

2.  Sliding hiatal hernia.

3.  No evidence of displaced fracture.  No pneumothorax.

## 2020-05-17 DIAGNOSIS — L570 Actinic keratosis: Secondary | ICD-10-CM | POA: Diagnosis not present

## 2020-05-17 DIAGNOSIS — Z85828 Personal history of other malignant neoplasm of skin: Secondary | ICD-10-CM | POA: Diagnosis not present

## 2020-05-17 DIAGNOSIS — L814 Other melanin hyperpigmentation: Secondary | ICD-10-CM | POA: Diagnosis not present

## 2020-05-17 DIAGNOSIS — Z08 Encounter for follow-up examination after completed treatment for malignant neoplasm: Secondary | ICD-10-CM | POA: Diagnosis not present

## 2020-05-17 DIAGNOSIS — L2084 Intrinsic (allergic) eczema: Secondary | ICD-10-CM | POA: Diagnosis not present

## 2020-07-23 DIAGNOSIS — J4541 Moderate persistent asthma with (acute) exacerbation: Secondary | ICD-10-CM | POA: Diagnosis not present

## 2020-07-23 DIAGNOSIS — Z1231 Encounter for screening mammogram for malignant neoplasm of breast: Secondary | ICD-10-CM | POA: Diagnosis not present

## 2020-07-23 DIAGNOSIS — R0600 Dyspnea, unspecified: Secondary | ICD-10-CM | POA: Diagnosis not present

## 2020-08-03 DIAGNOSIS — F32A Depression, unspecified: Secondary | ICD-10-CM | POA: Diagnosis not present

## 2020-08-03 DIAGNOSIS — Z882 Allergy status to sulfonamides status: Secondary | ICD-10-CM | POA: Diagnosis not present

## 2020-08-03 DIAGNOSIS — R0600 Dyspnea, unspecified: Secondary | ICD-10-CM | POA: Diagnosis not present

## 2020-08-03 DIAGNOSIS — M8588 Other specified disorders of bone density and structure, other site: Secondary | ICD-10-CM | POA: Diagnosis not present

## 2020-08-03 DIAGNOSIS — K760 Fatty (change of) liver, not elsewhere classified: Secondary | ICD-10-CM | POA: Diagnosis not present

## 2020-08-03 DIAGNOSIS — B370 Candidal stomatitis: Secondary | ICD-10-CM | POA: Diagnosis not present

## 2020-08-03 DIAGNOSIS — R9431 Abnormal electrocardiogram [ECG] [EKG]: Secondary | ICD-10-CM | POA: Diagnosis not present

## 2020-08-03 DIAGNOSIS — Z20822 Contact with and (suspected) exposure to covid-19: Secondary | ICD-10-CM | POA: Diagnosis not present

## 2020-08-03 DIAGNOSIS — K449 Diaphragmatic hernia without obstruction or gangrene: Secondary | ICD-10-CM | POA: Diagnosis not present

## 2020-08-03 DIAGNOSIS — R0682 Tachypnea, not elsewhere classified: Secondary | ICD-10-CM | POA: Diagnosis not present

## 2020-08-03 DIAGNOSIS — R0602 Shortness of breath: Secondary | ICD-10-CM | POA: Diagnosis not present

## 2020-08-03 DIAGNOSIS — J029 Acute pharyngitis, unspecified: Secondary | ICD-10-CM | POA: Diagnosis not present

## 2020-08-03 DIAGNOSIS — Z79899 Other long term (current) drug therapy: Secondary | ICD-10-CM | POA: Diagnosis not present

## 2020-08-03 DIAGNOSIS — J209 Acute bronchitis, unspecified: Secondary | ICD-10-CM | POA: Diagnosis not present

## 2020-08-03 DIAGNOSIS — R7989 Other specified abnormal findings of blood chemistry: Secondary | ICD-10-CM | POA: Diagnosis not present

## 2020-08-03 DIAGNOSIS — K219 Gastro-esophageal reflux disease without esophagitis: Secondary | ICD-10-CM | POA: Diagnosis not present

## 2020-08-03 DIAGNOSIS — J4541 Moderate persistent asthma with (acute) exacerbation: Secondary | ICD-10-CM | POA: Diagnosis not present

## 2020-08-03 DIAGNOSIS — I2510 Atherosclerotic heart disease of native coronary artery without angina pectoris: Secondary | ICD-10-CM | POA: Diagnosis not present

## 2020-08-03 DIAGNOSIS — J984 Other disorders of lung: Secondary | ICD-10-CM | POA: Diagnosis not present

## 2020-08-03 DIAGNOSIS — J948 Other specified pleural conditions: Secondary | ICD-10-CM | POA: Diagnosis not present

## 2020-08-03 DIAGNOSIS — J45901 Unspecified asthma with (acute) exacerbation: Secondary | ICD-10-CM | POA: Diagnosis not present

## 2020-08-03 DIAGNOSIS — J45909 Unspecified asthma, uncomplicated: Secondary | ICD-10-CM | POA: Diagnosis not present

## 2020-10-05 DIAGNOSIS — B370 Candidal stomatitis: Secondary | ICD-10-CM | POA: Diagnosis not present

## 2020-10-05 DIAGNOSIS — J4540 Moderate persistent asthma, uncomplicated: Secondary | ICD-10-CM | POA: Diagnosis not present

## 2020-10-05 DIAGNOSIS — J309 Allergic rhinitis, unspecified: Secondary | ICD-10-CM | POA: Diagnosis not present

## 2020-10-11 DIAGNOSIS — K219 Gastro-esophageal reflux disease without esophagitis: Secondary | ICD-10-CM | POA: Diagnosis not present

## 2020-10-11 DIAGNOSIS — Z780 Asymptomatic menopausal state: Secondary | ICD-10-CM | POA: Diagnosis not present

## 2020-10-11 DIAGNOSIS — D721 Eosinophilia, unspecified: Secondary | ICD-10-CM | POA: Diagnosis not present

## 2020-10-11 DIAGNOSIS — Z9181 History of falling: Secondary | ICD-10-CM | POA: Diagnosis not present

## 2020-10-11 DIAGNOSIS — I10 Essential (primary) hypertension: Secondary | ICD-10-CM | POA: Diagnosis not present

## 2020-10-11 DIAGNOSIS — N951 Menopausal and female climacteric states: Secondary | ICD-10-CM | POA: Diagnosis not present

## 2020-10-11 DIAGNOSIS — J4530 Mild persistent asthma, uncomplicated: Secondary | ICD-10-CM | POA: Diagnosis not present

## 2020-10-11 DIAGNOSIS — E7800 Pure hypercholesterolemia, unspecified: Secondary | ICD-10-CM | POA: Diagnosis not present

## 2020-10-11 DIAGNOSIS — R635 Abnormal weight gain: Secondary | ICD-10-CM | POA: Diagnosis not present

## 2020-10-11 DIAGNOSIS — E559 Vitamin D deficiency, unspecified: Secondary | ICD-10-CM | POA: Diagnosis not present

## 2020-10-11 DIAGNOSIS — Z92241 Personal history of systemic steroid therapy: Secondary | ICD-10-CM | POA: Diagnosis not present

## 2020-10-11 DIAGNOSIS — Z0000 Encounter for general adult medical examination without abnormal findings: Secondary | ICD-10-CM | POA: Diagnosis not present

## 2020-10-13 DIAGNOSIS — J4530 Mild persistent asthma, uncomplicated: Secondary | ICD-10-CM | POA: Diagnosis not present

## 2020-10-13 DIAGNOSIS — I10 Essential (primary) hypertension: Secondary | ICD-10-CM | POA: Diagnosis not present

## 2020-10-13 DIAGNOSIS — Z9181 History of falling: Secondary | ICD-10-CM | POA: Diagnosis not present

## 2020-10-13 DIAGNOSIS — D721 Eosinophilia, unspecified: Secondary | ICD-10-CM | POA: Diagnosis not present

## 2020-10-13 DIAGNOSIS — E559 Vitamin D deficiency, unspecified: Secondary | ICD-10-CM | POA: Diagnosis not present

## 2020-10-13 DIAGNOSIS — R635 Abnormal weight gain: Secondary | ICD-10-CM | POA: Diagnosis not present

## 2020-10-13 DIAGNOSIS — E7800 Pure hypercholesterolemia, unspecified: Secondary | ICD-10-CM | POA: Diagnosis not present

## 2021-02-04 DIAGNOSIS — R0789 Other chest pain: Secondary | ICD-10-CM | POA: Diagnosis not present

## 2021-02-04 DIAGNOSIS — I10 Essential (primary) hypertension: Secondary | ICD-10-CM | POA: Diagnosis not present

## 2021-03-10 DIAGNOSIS — R0789 Other chest pain: Secondary | ICD-10-CM | POA: Diagnosis not present

## 2021-03-10 DIAGNOSIS — I10 Essential (primary) hypertension: Secondary | ICD-10-CM | POA: Diagnosis not present

## 2021-05-31 DIAGNOSIS — J4540 Moderate persistent asthma, uncomplicated: Secondary | ICD-10-CM | POA: Diagnosis not present

## 2021-05-31 DIAGNOSIS — J309 Allergic rhinitis, unspecified: Secondary | ICD-10-CM | POA: Diagnosis not present

## 2022-04-10 ENCOUNTER — Ambulatory Visit (INDEPENDENT_AMBULATORY_CARE_PROVIDER_SITE_OTHER): Payer: Medicare Other | Admitting: Internal Medicine

## 2022-04-10 ENCOUNTER — Encounter: Payer: Self-pay | Admitting: Internal Medicine

## 2022-04-10 VITALS — BP 128/72 | HR 96 | Temp 98.3°F | Ht 61.0 in | Wt 143.2 lb

## 2022-04-10 DIAGNOSIS — R0602 Shortness of breath: Secondary | ICD-10-CM | POA: Diagnosis not present

## 2022-04-10 DIAGNOSIS — K2100 Gastro-esophageal reflux disease with esophagitis, without bleeding: Secondary | ICD-10-CM

## 2022-04-10 DIAGNOSIS — R053 Chronic cough: Secondary | ICD-10-CM | POA: Diagnosis not present

## 2022-04-10 DIAGNOSIS — I10 Essential (primary) hypertension: Secondary | ICD-10-CM

## 2022-04-10 NOTE — Progress Notes (Signed)
Leslie Bullock    [id number]    [birth year]  Primary Care Physician:Husain, Jerelyn Scott, MD  Referring Physician: Georgann Housekeeper, MD [address] [place],  [place] [address] Reason for Consultation: shortness of breath Date of Consultation: 04/10/2022  Chief complaint:   Chief Complaint  Patient presents with   Consult    Covid 2020, productive SOB with activity      HPI:  Leslie Bullock is a 71 y.o. woman who presents for new patient evaluation of shortness of breath.   Symptoms of cough and shortness of breath started in 2020 after a URI which they thought was covid.   She coughs constantly and it is with copious amounts of mucus which she spits into a napkin. Her dyspnea limits her ADLs including work in the house, cleaning.  Cough is not necessarily related to exertion and does come on when she is at rest. The cough also wakes her up from her sleep. She feels dyspnea has progressed over the last couple of years.   She denies hemoptysis. She does have frequent throat clearing. She has a hard time getting a deep breath in rather than exhaling. She does have reflux and has been on pantoprazole which has helped a lot. She does have occasional breakthrough reflux. She is overdue for EGD follow up. PPI was started about 3 months ago. Modifying her diet has helped and eating earlier has helped. She is taking pantoprazole in the morning and omeprazole at night.   She was seen at [facility] in March 2023 and was diagnosed with asthma. Was treated for upper respiratory infections. She was put on claritin, singulair and this hasn't seemed to help. She was also put on inhalers ICS-LABA which she took until about 2 months ago. Wasn't helping. She does have an albuterol rescue which does seem to help shortness of breath which she takes less than once/week.   She did have a couple episodes of bronchitis when she was younger, no recurrent infections or childhood respiratory  disease.     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  Exposures: live at home with husband, grandson and his wife live in basement and they smoke. 2 dogs and a cat Smoking history: never smoker. She has significant passive smoke exposure both in childhood and with her spouse. Husband, no longer smoke.    Social History   Occupational History   Not on file  Tobacco Use   Smoking status: Never   Smokeless tobacco: Never  Substance and Sexual Activity   Alcohol use: Not on file   Drug use: Not on file   Sexual activity: Not on file    Relevant family history:  Family History  Problem Relation Age of Onset   CAD Son    Lung disease Neg Hx    Cancer - Lung Neg Hx     Past Medical History:  Diagnosis Date   Hypertension     Past Surgical History:  Procedure Laterality Date   ABDOMINAL HYSTERECTOMY     CESAREAN SECTION     INCONTINENCE SURGERY       Physical Exam: Blood pressure 128/72, pulse 96, temperature 98.3 F (36.8 C), temperature source Oral, height 5\' 1"  (1.549 m), weight 143 lb 3.2 oz (65 kg), SpO2 94 %. Gen:      No acute distress, frequent dry cough and throat clearing ENT:  no nasal polyps, mucus membranes moist Lungs:  No increased respiratory effort, symmetric chest wall excursion, clear to auscultation bilaterally, no wheezes or crackles CV:         Regular rate and rhythm; no murmurs, rubs, or gallops.  No pedal edema Abd:      + bowel sounds; soft, non-tender; no distension MSK: no acute synovitis of DIP or PIP joints, no mechanics hands.  Skin:      Warm and dry; no rashes Neuro: normal speech, no focal facial asymmetry Psych: alert and oriented x3, normal mood and affect   Data Reviewed/Medical Decision Making:  Independent interpretation of tests: Imaging:  Review of patient's chest xray 2020 images revealed no acute process, mild hyperinflation. The patient's images have been independently reviewed by me.    CTPE  study Dec 2021 at [facility] was negative for PE. These images are not available for review.   PFTs: Cleda Daub at [facility] in March 2023 shows moderate restriction, no obstruction, no BD response. FEV1/FVC 114.4% of predicted. FVC 59.1% DLCO 51%   Labs: absolute eosinophil count Feb 2022 was 300.   Immunization status:  Immunization History  Administered Date(s) Administered   PFIZER(Purple Top)SARS-COV-2 Vaccination 12/06/2019, 12/29/2019     I reviewed prior external note(s) from [facility], PCP  I reviewed the result(s) of the labs and imaging as noted above.   I have ordered PFT   Assessment:  Shortness of breath Chronic cough GERD  Plan/Recommendations:  Shortness of breath - consider smoking related lung disease (passive exposure) including asthma, restriction from deconditioning. Will obtain PFTS with lung volumes to confirm if there is restriction. She has responded to prednisone in the past and perceives benefit from prn albuterol nebs so asthma still a possibilty.   Chronic cough is likely related to GERD  Continue PPI BID as you are already doing. Discussed GERD lifestyle modifications. Follow up with GI for surveillence EGD for longstanding GERD. Consider ENT evaluation if no improvement with lifestyle modification  We discussed disease management and progression at length today.    Return to Care: Return in about 4 weeks (around 05/08/2022).  Durel Salts, MD Pulmonary and Critical Care Medicine [facility] Office:[phone]  CC: Georgann Housekeeper, MD

## 2022-04-10 NOTE — Patient Instructions (Addendum)
Please schedule follow up scheduled with myself in 1 months.  If my schedule is not open yet, we will contact you with a reminder closer to that time. Please call [phone] if you haven't heard from [place] a month before.   Before your next visit I would like you to have: Full set of PFTs - next available. Can folllow up  I think your cough is related to your reflux. Continue the acid reflux medications for now.  Please follow up with your GI doctor about your endoscopy.   What is GERD? Gastroesophageal reflux disease (GERD) is gastroesophageal reflux diseasewhich occurs when the lower esophageal sphincter (LES) opens spontaneously, for varying periods of time, or does not close properly and stomach contents rise up into the esophagus. GER is also called acid reflux or acid regurgitation, because digestive juices--called acids--rise up with the food. The esophagus is the tube that carries food from the mouth to the stomach. The LES is a ring of muscle at the bottom of the esophagus that acts like a valve between the esophagus and stomach.  When acid reflux occurs, food or fluid can be tasted in the back of the mouth. When refluxed stomach acid touches the lining of the esophagus it may cause a burning sensation in the chest or throat called heartburn or acid indigestion. Occasional reflux is common. Persistent reflux that occurs more than twice a week is considered GERD, and it can eventually lead to more serious health problems. People of all ages can have GERD. Studies have shown that GERD may worsen or contribute to asthma, chronic cough, and pulmonary fibrosis.   What are the symptoms of GERD? The main symptom of GERD in adults is frequent heartburn, also called acid indigestion--burning-type pain in the lower part of the mid-chest, behind the breast bone, and in the mid-abdomen.  Not all reflux is acidic in nature, and many patients don't have heart burn at all. Sometimes it feels like a cough  (either dry or with mucus), choking sensation, asthma, shortness of breath, waking up at night, frequent throat clearing, or trouble swallowing.    What causes GERD? The reason some people develop GERD is still unclear. However, research shows that in people with GERD, the LES relaxes while the rest of the esophagus is working. Anatomical abnormalities such as a hiatal hernia may also contribute to GERD. A hiatal hernia occurs when the upper part of the stomach and the LES move above the diaphragm, the muscle wall that separates the stomach from the chest. Normally, the diaphragm helps the LES keep acid from rising up into the esophagus. When a hiatal hernia is present, acid reflux can occur more easily. A hiatal hernia can occur in people of any age and is most often a normal finding in otherwise healthy people over age 21. Most of the time, a hiatal hernia produces no symptoms.   Other factors that may contribute to GERD include - Obesity or recent weight gain - Pregnancy  - Smoking  - Diet - Certain medications  Common foods that can worsen reflux symptoms include: - carbonated beverages - artificial sweeteners - citrus fruits  - chocolate  - drinks with caffeine or alcohol  - fatty and fried foods  - garlic and onions  - mint flavorings  - spicy foods  - tomato-based foods, like spaghetti sauce, salsa, chili, and pizza   Lifestyle Changes If you smoke, stop.  Avoid foods and beverages that worsen symptoms (see above.) Lose weight  if needed.  Eat small, frequent meals.  Wear loose-fitting clothes.  Avoid lying down for 3 hours after a meal.  Raise the head of your bed 6 to 8 inches by securing wood blocks under the bedposts. Just using extra pillows will not help, but using a wedge-shaped pillow may be helpful.  Medications  H2 blockers, such as cimetidine (Tagamet HB), famotidine (Pepcid AC), nizatidine (Axid AR), and ranitidine (Zantac 75), decrease acid production. They are  available in prescription strength and over-the-counter strength. These drugs provide short-term relief and are effective for about half of those who have GERD symptoms.  Proton pump inhibitors include omeprazole (Prilosec, Zegerid), lansoprazole (Prevacid), pantoprazole (Protonix), rabeprazole (Aciphex), and esomeprazole (Nexium), which are available by prescription. Prilosec is also available in over-the-counter strength. Proton pump inhibitors are more effective than H2 blockers and can relieve symptoms and heal the esophageal lining in almost everyone who has GERD.  Because drugs work in different ways, combinations of medications may help control symptoms. People who get heartburn after eating may take both antacids and H2 blockers. The antacids work first to neutralize the acid in the stomach, and then the H2 blockers act on acid production. By the time the antacid stops working, the H2 blocker will have stopped acid production. Your health care provider is the best source of information about how to use medications for GERD.   Points to Remember 1. You can have GERD without having heartburn. Your symptoms could include a dry cough, asthma symptoms, or trouble swallowing.  2. Taking medications daily as prescribed is important in controlling you symptoms.  Sometimes it can take up to 8 weeks to fully achieve the effects of the medications prescribed.  3. Coughing related to GERD can be difficult to treat and is very frustrating!  However, it is important to stick with these medications and lifestyle modifications before pursuing more aggressive or invasive test and treatments.

## 2022-05-15 ENCOUNTER — Ambulatory Visit: Payer: Medicare Other | Admitting: Internal Medicine

## 2022-06-01 ENCOUNTER — Ambulatory Visit: Payer: Medicare Other | Admitting: Internal Medicine

## 2022-06-15 ENCOUNTER — Ambulatory Visit (INDEPENDENT_AMBULATORY_CARE_PROVIDER_SITE_OTHER): Payer: Medicare Other | Admitting: Internal Medicine

## 2022-06-15 DIAGNOSIS — R0602 Shortness of breath: Secondary | ICD-10-CM | POA: Diagnosis not present

## 2022-06-15 LAB — PULMONARY FUNCTION TEST
DL/VA % pred: 110 %
DL/VA: 4.64 ml/min/mmHg/L
DLCO cor % pred: 72 %
DLCO cor: 12.56 ml/min/mmHg
DLCO unc % pred: 72 %
DLCO unc: 12.56 ml/min/mmHg
FEF 25-75 Post: 2.16 L/sec
FEF 25-75 Pre: 1.49 L/sec
FEF2575-%Change-Post: 45 %
FEF2575-%Pred-Post: 131 %
FEF2575-%Pred-Pre: 90 %
FEV1-%Change-Post: 14 %
FEV1-%Pred-Post: 65 %
FEV1-%Pred-Pre: 57 %
FEV1-Post: 1.27 L
FEV1-Pre: 1.11 L
FEV1FVC-%Change-Post: 3 %
FEV1FVC-%Pred-Pre: 112 %
FEV6-%Change-Post: 10 %
FEV6-%Pred-Post: 58 %
FEV6-%Pred-Pre: 53 %
FEV6-Post: 1.44 L
FEV6-Pre: 1.31 L
FEV6FVC-%Pred-Post: 105 %
FEV6FVC-%Pred-Pre: 105 %
FVC-%Change-Post: 10 %
FVC-%Pred-Post: 56 %
FVC-%Pred-Pre: 50 %
FVC-Post: 1.44 L
FVC-Pre: 1.31 L
Post FEV1/FVC ratio: 88 %
Post FEV6/FVC ratio: 100 %
Pre FEV1/FVC ratio: 85 %
Pre FEV6/FVC Ratio: 100 %
RV % pred: 83 %
RV: 1.73 L
TLC % pred: 76 %
TLC: 3.54 L

## 2022-06-15 NOTE — Patient Instructions (Signed)
Full PFT performed today. °

## 2022-06-15 NOTE — Progress Notes (Signed)
Full PFT performed today. °

## 2022-06-19 ENCOUNTER — Encounter: Payer: Self-pay | Admitting: Internal Medicine

## 2022-06-19 ENCOUNTER — Ambulatory Visit (INDEPENDENT_AMBULATORY_CARE_PROVIDER_SITE_OTHER): Payer: Medicare Other | Admitting: Internal Medicine

## 2022-06-19 VITALS — BP 126/68 | HR 86 | Temp 98.0°F | Ht 60.0 in | Wt 142.8 lb

## 2022-06-19 DIAGNOSIS — J4540 Moderate persistent asthma, uncomplicated: Secondary | ICD-10-CM | POA: Diagnosis not present

## 2022-06-19 DIAGNOSIS — I480 Paroxysmal atrial fibrillation: Secondary | ICD-10-CM | POA: Diagnosis not present

## 2022-06-19 DIAGNOSIS — R058 Other specified cough: Secondary | ICD-10-CM

## 2022-06-19 MED ORDER — LEVALBUTEROL TARTRATE 45 MCG/ACT IN AERO: 2.0000 | INHALATION_SPRAY | Freq: Four times a day (QID) | RESPIRATORY_TRACT | 12 refills | Status: AC | PRN

## 2022-06-19 MED ORDER — FLUTICASONE PROPIONATE 50 MCG/ACT NA SUSP: 1.0000 | Freq: Every day | NASAL | 11 refills | Status: AC

## 2022-06-19 NOTE — Patient Instructions (Signed)
Please schedule follow up scheduled with myself in 4 months.  If my schedule is not open yet, we will contact you with a reminder closer to that time. Please call [phone] if you haven't heard from [place] a month before.   Your cough is likely a combination of post nasal drainage and stomach reflux.   Continue pantoprazole.  Continue xyzal Continue montelukast Start flonase (sent to pharmacy.) I want you to follow up with cardiology for atrial fibrillation - your heart rate was regular today but it sounds like you were going in and out before.  I am stopping albuterol and switching you to levalbuterol which works the same way but should not increase your heart rate.  Let's get good control of your cough and if you still have shortness of breath we can start back on a maintenance steroid inhaler like wixela/advair.   Fluticasone/flonase - 1 spray on each side of your nose twice a day for first week, then 1 spray on each side.   Instructions for use: If you also use a saline nasal spray or rinse, use that first. Position the head with the chin slightly tucked. Use the right hand to spray into the left nostril and the right hand to spray into the left nostril.   Point the bottle away from the septum of your nose (cartilage that divides the two sides of your nose).  Hold the nostril closed on the opposite side from where you will spray Spray once and gently sniff to pull the medicine into the higher parts of your nose.  Don't sniff too hard as the medicine will drain down the back of your throat instead. Repeat with a second spray on the same side if prescribed. Repeat on the other side of your nose.   What is GERD? Gastroesophageal reflux disease (GERD) is gastroesophageal reflux diseasewhich occurs when the lower esophageal sphincter (LES) opens spontaneously, for varying periods of time, or does not close properly and stomach contents rise up into the esophagus. GER is also called acid reflux  or acid regurgitation, because digestive juices--called acids--rise up with the food. The esophagus is the tube that carries food from the mouth to the stomach. The LES is a ring of muscle at the bottom of the esophagus that acts like a valve between the esophagus and stomach.  When acid reflux occurs, food or fluid can be tasted in the back of the mouth. When refluxed stomach acid touches the lining of the esophagus it may cause a burning sensation in the chest or throat called heartburn or acid indigestion. Occasional reflux is common. Persistent reflux that occurs more than twice a week is considered GERD, and it can eventually lead to more serious health problems. People of all ages can have GERD. Studies have shown that GERD may worsen or contribute to asthma, chronic cough, and pulmonary fibrosis.   What are the symptoms of GERD? The main symptom of GERD in adults is frequent heartburn, also called acid indigestion--burning-type pain in the lower part of the mid-chest, behind the breast bone, and in the mid-abdomen.  Not all reflux is acidic in nature, and many patients don't have heart burn at all. Sometimes it feels like a cough (either dry or with mucus), choking sensation, asthma, shortness of breath, waking up at night, frequent throat clearing, or trouble swallowing.    What causes GERD? The reason some people develop GERD is still unclear. However, research shows that in people with GERD, the LES  relaxes while the rest of the esophagus is working. Anatomical abnormalities such as a hiatal hernia may also contribute to GERD. A hiatal hernia occurs when the upper part of the stomach and the LES move above the diaphragm, the muscle wall that separates the stomach from the chest. Normally, the diaphragm helps the LES keep acid from rising up into the esophagus. When a hiatal hernia is present, acid reflux can occur more easily. A hiatal hernia can occur in people of any age and is most often a  normal finding in otherwise healthy people over age 45. Most of the time, a hiatal hernia produces no symptoms.   Other factors that may contribute to GERD include - Obesity or recent weight gain - Pregnancy  - Smoking  - Diet - Certain medications  Common foods that can worsen reflux symptoms include: - carbonated beverages - artificial sweeteners - citrus fruits  - chocolate  - drinks with caffeine or alcohol  - fatty and fried foods  - garlic and onions  - mint flavorings  - spicy foods  - tomato-based foods, like spaghetti sauce, salsa, chili, and pizza   Lifestyle Changes If you smoke, stop.  Avoid foods and beverages that worsen symptoms (see above.) Lose weight if needed.  Eat small, frequent meals.  Wear loose-fitting clothes.  Avoid lying down for 3 hours after a meal.  Raise the head of your bed 6 to 8 inches by securing wood blocks under the bedposts. Just using extra pillows will not help, but using a wedge-shaped pillow may be helpful.  Medications  H2 blockers, such as cimetidine (Tagamet HB), famotidine (Pepcid AC), nizatidine (Axid AR), and ranitidine (Zantac 75), decrease acid production. They are available in prescription strength and over-the-counter strength. These drugs provide short-term relief and are effective for about half of those who have GERD symptoms.  Proton pump inhibitors include omeprazole (Prilosec, Zegerid), lansoprazole (Prevacid), pantoprazole (Protonix), rabeprazole (Aciphex), and esomeprazole (Nexium), which are available by prescription. Prilosec is also available in over-the-counter strength. Proton pump inhibitors are more effective than H2 blockers and can relieve symptoms and heal the esophageal lining in almost everyone who has GERD.  Because drugs work in different ways, combinations of medications may help control symptoms. People who get heartburn after eating may take both antacids and H2 blockers. The antacids work first to  neutralize the acid in the stomach, and then the H2 blockers act on acid production. By the time the antacid stops working, the H2 blocker will have stopped acid production. Your health care provider is the best source of information about how to use medications for GERD.   Points to Remember 1. You can have GERD without having heartburn. Your symptoms could include a dry cough, asthma symptoms, or trouble swallowing.  2. Taking medications daily as prescribed is important in controlling you symptoms.  Sometimes it can take up to 8 weeks to fully achieve the effects of the medications prescribed.  3. Coughing related to GERD can be difficult to treat and is very frustrating!  However, it is important to stick with these medications and lifestyle modifications before pursuing more aggressive or invasive test and treatments.

## 2022-06-19 NOTE — Progress Notes (Signed)
Leslie Bullock    [id number]    [birth year]  Primary Care Physician:Husain, Denton Ar, MD Date of Appointment: 06/19/2022 Established Patient Visit  Chief complaint:   Chief Complaint  Patient presents with   Follow-up    Review pft test      HPI: Leslie Bullock is a 71 y.o. woman with history of shortness of breath, cough and GERD.   Interval Updates: Here for follow up after PFTS which show restriction to ventilation and bronchodilator responsiveness.   She has chronic nasal drainage and picked up some xyzal and after taking this for one day seems to help.  Used to have bad acid reflux but this has gotten better with daily PPI. She sleeps with adjustable bed upright already. Cough is very bothersome, usually dry, usually worse at night.   I have reviewed the patient's family social and past medical history and updated as appropriate.   Past Medical History:  Diagnosis Date   Hypertension     Past Surgical History:  Procedure Laterality Date   ABDOMINAL HYSTERECTOMY     CESAREAN SECTION     INCONTINENCE SURGERY      Family History  Problem Relation Age of Onset   CAD Son    Lung disease Neg Hx    Cancer - Lung Neg Hx     Social History   Occupational History   Not on file  Tobacco Use   Smoking status: Never   Smokeless tobacco: Never  Substance and Sexual Activity   Alcohol use: Not on file   Drug use: Not on file   Sexual activity: Not on file     Physical Exam: Blood pressure 126/68, pulse 86, temperature 98 F (36.7 C), temperature source Oral, height 5' (1.524 m), weight 142 lb 12.8 oz (64.8 kg), SpO2 97 %.  Gen:      No acute distress, frequent coughing ENT:  _+cobblestoning in oropharynx, no nasal polyps, mucus membranes moist Lungs:    No increased respiratory effort, symmetric chest wall excursion, clear to auscultation bilaterally, no wheezes or crackles CV:         Regular rate and rhythm; no murmurs, rubs, or gallops.  No pedal  edema   Data Reviewed: Imaging: I have personally reviewed the chest xray from 03/2019 rib series which shows hyperinflation.   CTPE 2021 at [facility] report reviewed - no PE, small hiatal hernia, hepatic steatosis. No consolidation or pleural effusion  PFTs:     Latest Ref Rng & Units 06/15/2022    9:38 AM  PFT Results  FVC-Pre L 1.31  P  FVC-Predicted Pre % 50  P  FVC-Post L 1.44  P  FVC-Predicted Post % 56  P  Pre FEV1/FVC % % 85  P  Post FEV1/FCV % % 88  P  FEV1-Pre L 1.11  P  FEV1-Predicted Pre % 57  P  FEV1-Post L 1.27  P  DLCO uncorrected ml/min/mmHg 12.56  P  DLCO UNC% % 72  P  DLCO corrected ml/min/mmHg 12.56  P  DLCO COR %Predicted % 72  P  DLVA Predicted % 110  P  TLC L 3.54  P  TLC % Predicted % 76  P  RV % Predicted % 83  P    P Preliminary result   I have personally reviewed the patient's PFTs and moderate restriction to ventilation. Spirometry shows significant response to bronchodilator.   Labs:  Immunization status: Immunization History  Administered Date(s)  Administered   PFIZER(Purple Top)SARS-COV-2 Vaccination 12/06/2019, 12/29/2019    Assessment:  Shortness of breath, possible asthma with reversible obstruction on pFTS Chronic cough - UACS GERD - on PPI Atrial fibrillation - paroxysmal  Plan/Recommendations:  Your cough is likely a combination of post nasal drainage and stomach reflux.   Continue pantoprazole.  Continue xyzal Continue montelukast Start flonase (sent to pharmacy.) I want you to follow up with cardiology for atrial fibrillation - your heart rate was regular today but it sounds like you were going in and out before.  I am stopping albuterol and switching you to levalbuterol which works the same way but should not increase your heart rate.  Let's get good control of your cough and if you still have shortness of breath we can start back on a maintenance steroid inhaler like wixela/advair.    Return to Care: Return in about  4 months (around 10/19/2022).   Durel Salts, MD Pulmonary and Critical Care Medicine [facility] Office:[phone]

## 2023-07-05 ENCOUNTER — Ambulatory Visit: Payer: Medicare Other | Admitting: Internal Medicine
